# Patient Record
Sex: Male | Born: 1990 | Hispanic: No | Marital: Single | Smoking: Never smoker
Health system: Southern US, Community
[De-identification: ages and names within clinical notes are randomized; demographics above are authoritative.]

---
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
## 2010-09-18 ENCOUNTER — Emergency Department (HOSPITAL_COMMUNITY)
Admission: EM | Admit: 2010-09-18 | Discharge: 2010-09-18 | Disposition: A | Discharge status: Home / Self Care | Payer: Self-pay | Attending: Emergency Medicine | Admitting: Emergency Medicine

## 2010-09-18 ENCOUNTER — Emergency Department (HOSPITAL_COMMUNITY): Payer: Self-pay

## 2010-09-18 DIAGNOSIS — Z202 Contact with and (suspected) exposure to infections with a predominantly sexual mode of transmission: Secondary | ICD-10-CM | POA: Insufficient documentation

## 2010-09-18 DIAGNOSIS — M79609 Pain in unspecified limb: Secondary | ICD-10-CM | POA: Insufficient documentation

## 2010-09-18 DIAGNOSIS — W2209XA Striking against other stationary object, initial encounter: Secondary | ICD-10-CM | POA: Insufficient documentation

## 2010-09-18 DIAGNOSIS — Y929 Unspecified place or not applicable: Secondary | ICD-10-CM | POA: Insufficient documentation

## 2010-09-18 DIAGNOSIS — S62329A Displaced fracture of shaft of unspecified metacarpal bone, initial encounter for closed fracture: Secondary | ICD-10-CM | POA: Insufficient documentation

## 2010-09-18 DIAGNOSIS — M7989 Other specified soft tissue disorders: Secondary | ICD-10-CM | POA: Insufficient documentation

## 2010-09-19 LAB — GC/CHLAMYDIA PROBE AMP, GENITAL
Chlamydia, DNA Probe: NEGATIVE
GC Probe Amp, Genital: NEGATIVE

## 2011-03-08 ENCOUNTER — Emergency Department (HOSPITAL_COMMUNITY): Payer: No Typology Code available for payment source

## 2011-03-08 ENCOUNTER — Emergency Department (HOSPITAL_COMMUNITY)
Admission: EM | Admit: 2011-03-08 | Discharge: 2011-03-08 | Disposition: A | Discharge status: Home / Self Care | Payer: No Typology Code available for payment source | Attending: Emergency Medicine | Admitting: Emergency Medicine

## 2011-03-08 DIAGNOSIS — S060X0A Concussion without loss of consciousness, initial encounter: Secondary | ICD-10-CM | POA: Insufficient documentation

## 2011-03-08 DIAGNOSIS — R51 Headache: Secondary | ICD-10-CM | POA: Insufficient documentation

## 2011-03-10 ENCOUNTER — Other Ambulatory Visit: Payer: Self-pay | Admitting: Specialist

## 2011-03-10 ENCOUNTER — Ambulatory Visit: Payer: No Typology Code available for payment source

## 2011-03-10 DIAGNOSIS — R6889 Other general symptoms and signs: Secondary | ICD-10-CM

## 2011-03-31 ENCOUNTER — Ambulatory Visit
Admission: RE | Admit: 2011-03-31 | Discharge: 2011-03-31 | Disposition: A | Discharge status: Home / Self Care | Payer: No Typology Code available for payment source | Source: Ambulatory Visit | Attending: Specialist | Admitting: Specialist

## 2011-03-31 DIAGNOSIS — R6889 Other general symptoms and signs: Secondary | ICD-10-CM

## 2012-03-11 IMAGING — CR DG HAND COMPLETE 3+V*R*
3 series · 3 of 3 positions shown · non-contrast
Comparison: None

CLINICAL DATA: Injured right hand.

RIGHT HAND - COMPLETE 3+ VIEW

[x hand pa right]
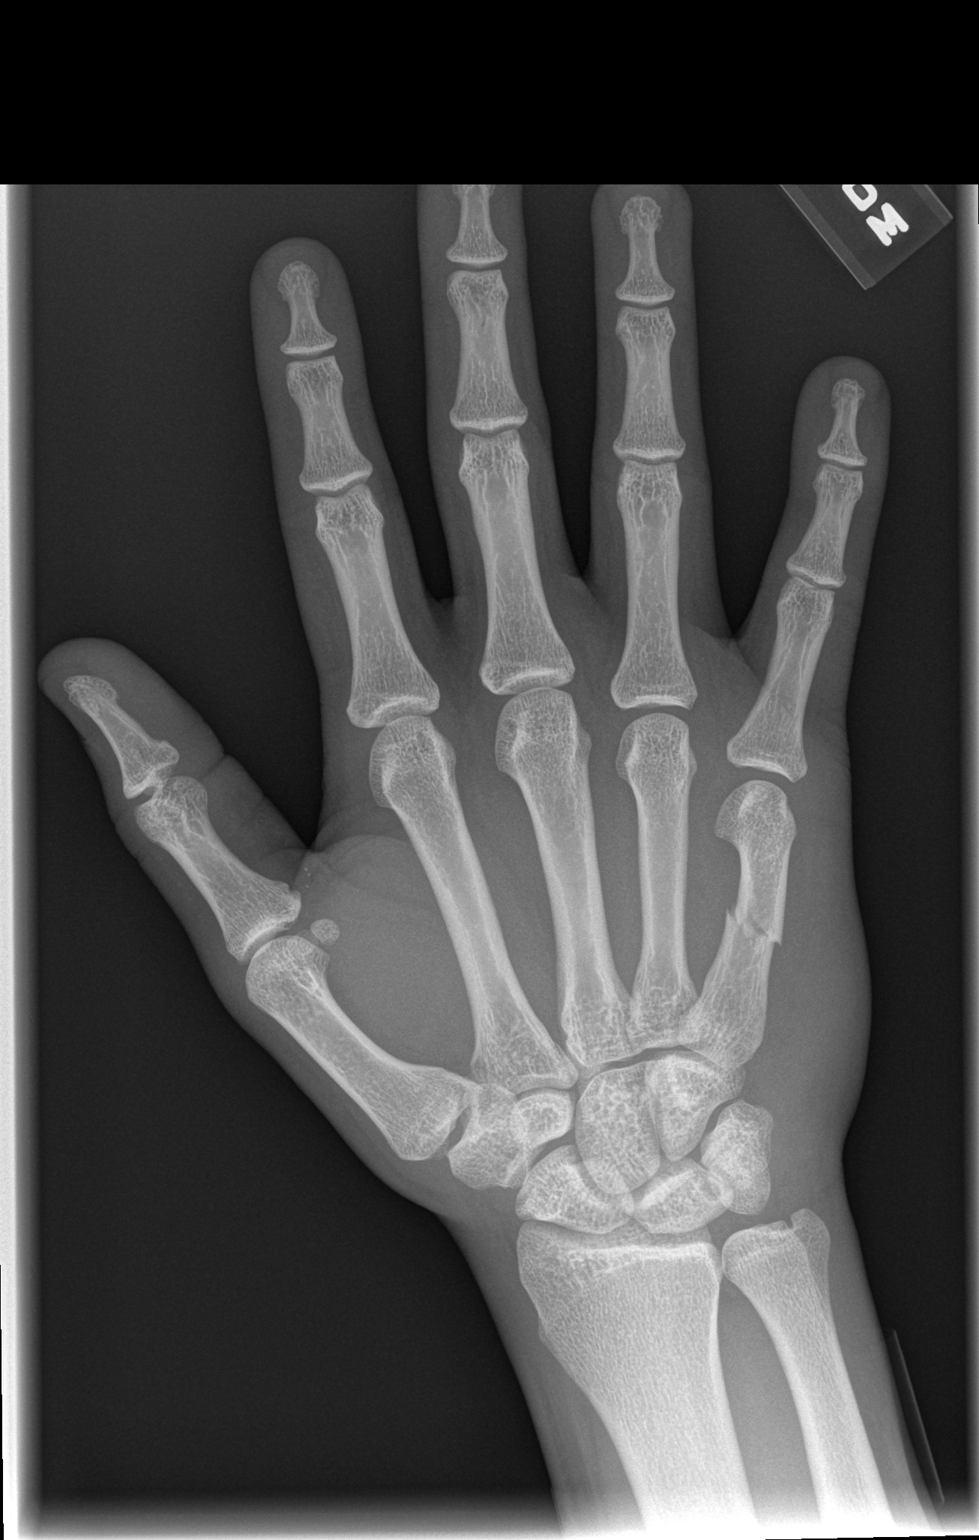

[x hand oblique right]
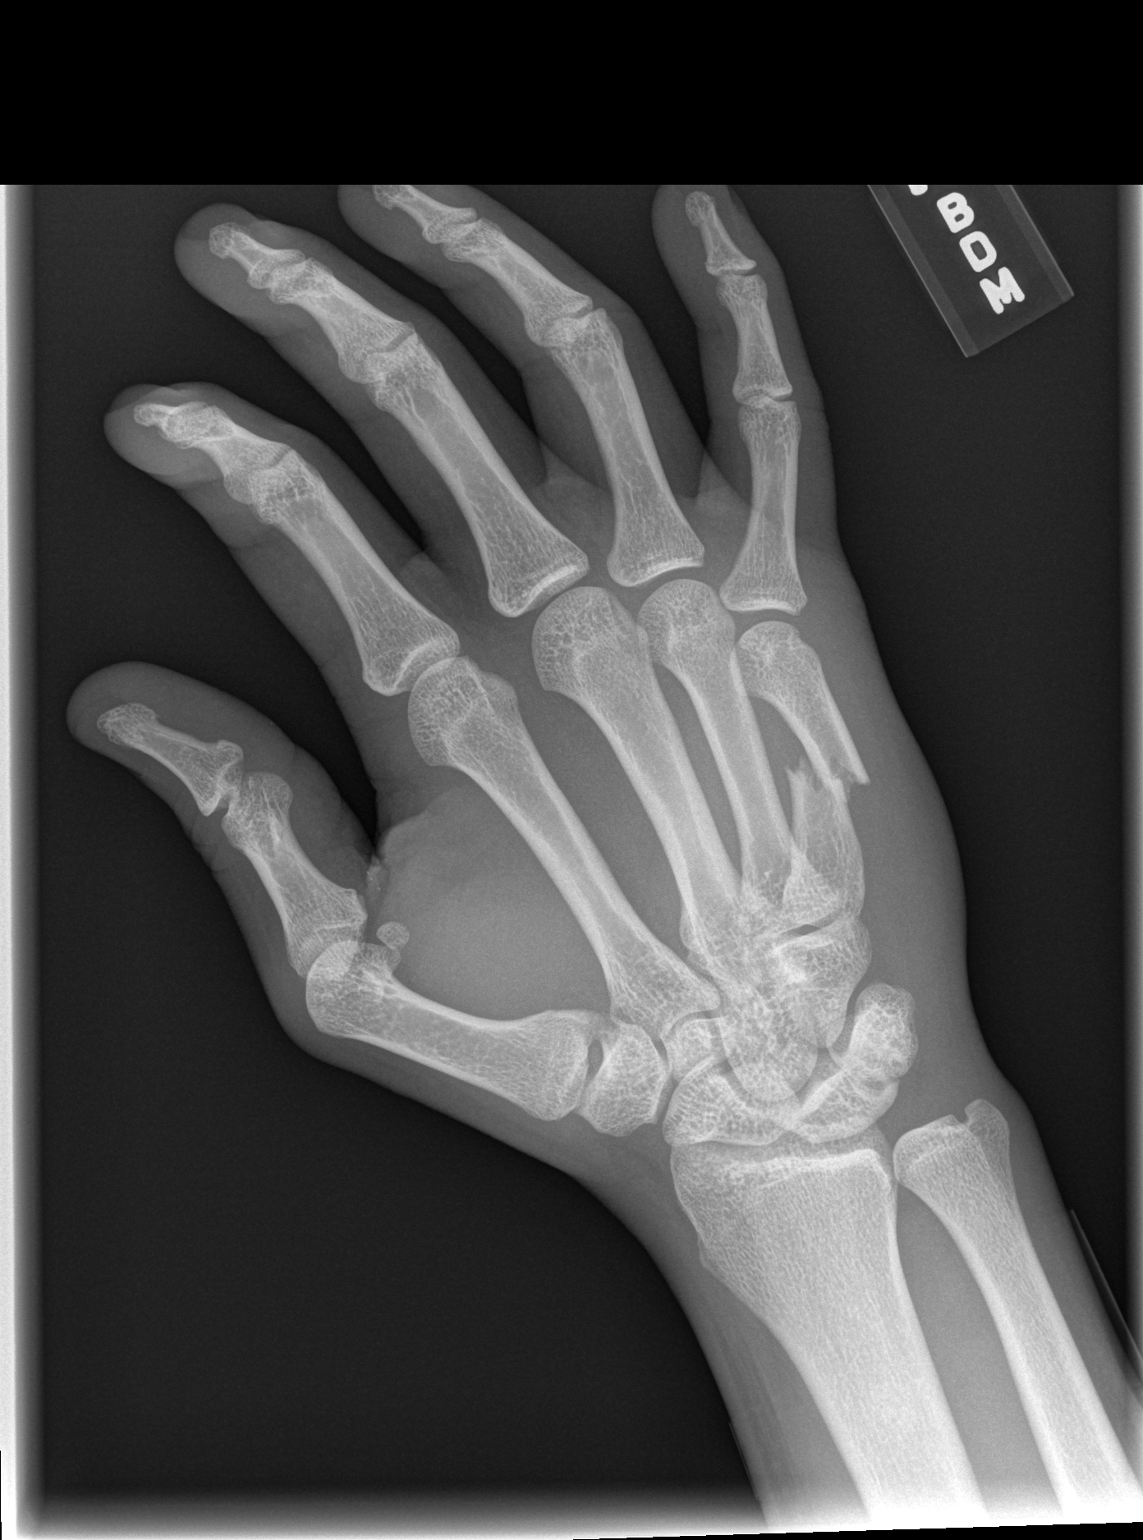

[x hand lat right]
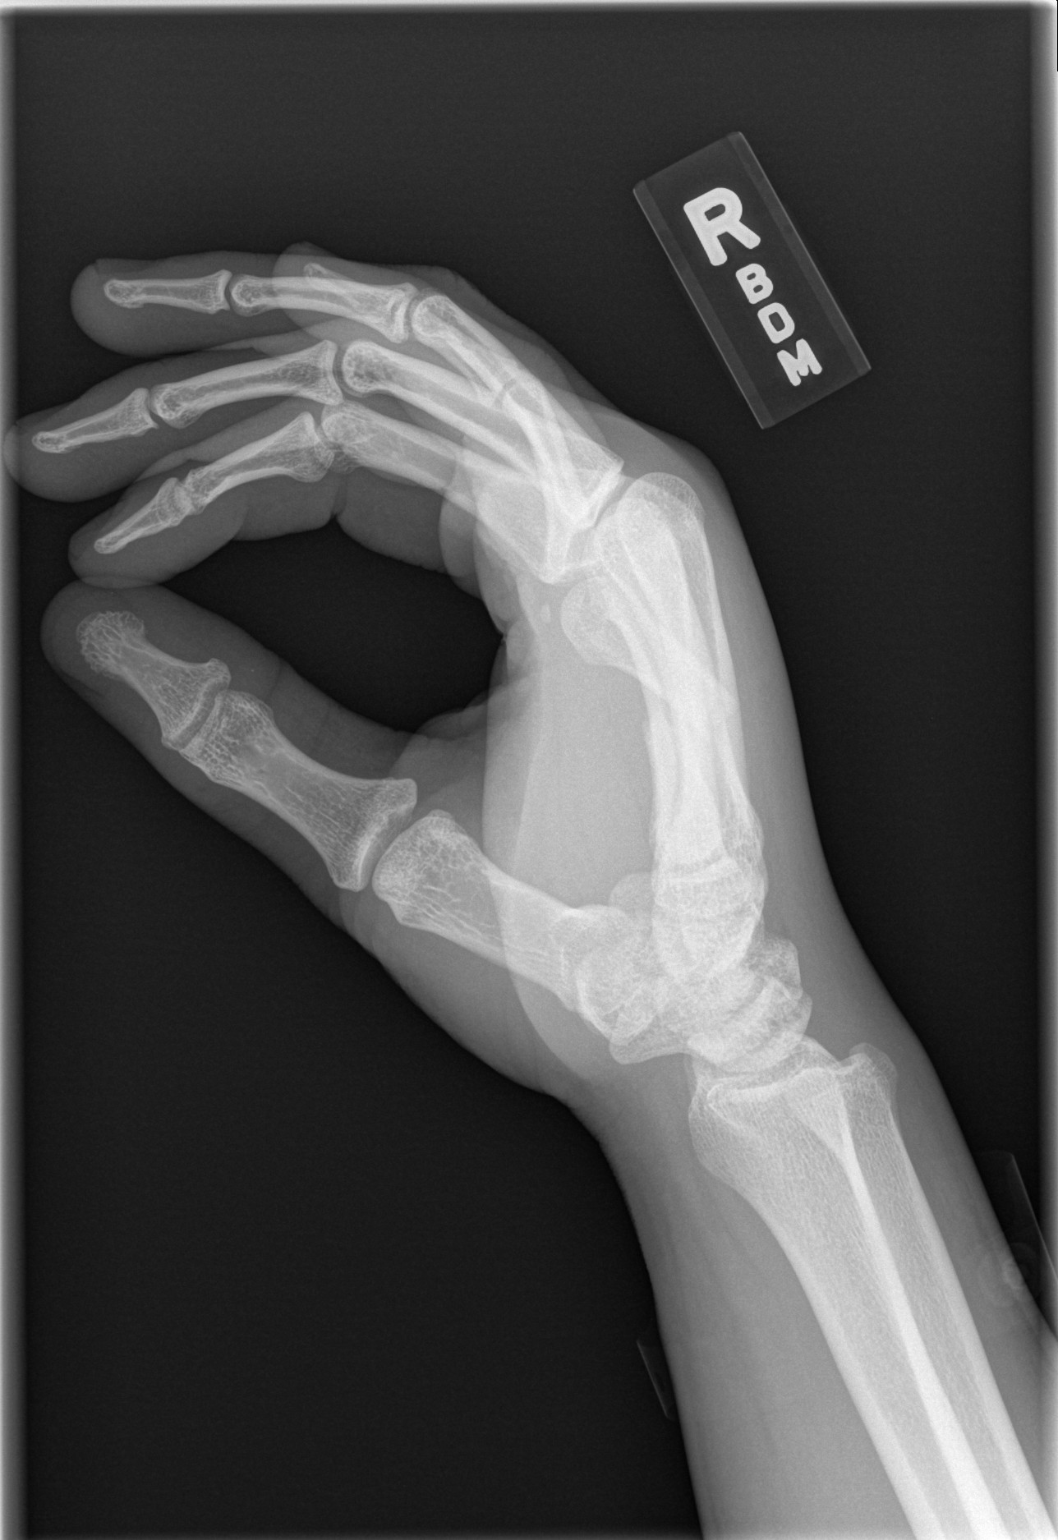

[3 of 3 positions shown; findings below may reference images not displayed]

FINDINGS: There is a displaced largely transverse fracture through
the mid fifth metacarpal shaft.  There is one shaft width of
posterior displacement.  The joint spaces are maintained.  No other
fractures are seen.
IMPRESSION: Displaced fifth metacarpal shaft fracture.

## 2015-12-26 ENCOUNTER — Encounter (HOSPITAL_COMMUNITY): Payer: Self-pay

## 2015-12-26 ENCOUNTER — Emergency Department (HOSPITAL_COMMUNITY): Payer: No Typology Code available for payment source

## 2015-12-26 ENCOUNTER — Emergency Department (HOSPITAL_COMMUNITY)
Admission: EM | Admit: 2015-12-26 | Discharge: 2015-12-26 | Disposition: A | Discharge status: Home / Self Care | Payer: Self-pay | Attending: Emergency Medicine | Admitting: Emergency Medicine

## 2015-12-26 DIAGNOSIS — R079 Chest pain, unspecified: Secondary | ICD-10-CM | POA: Insufficient documentation

## 2015-12-26 LAB — BASIC METABOLIC PANEL
ANION GAP: 8 (ref 5–15)
BUN: 15 mg/dL (ref 6–20)
CALCIUM: 9.2 mg/dL (ref 8.9–10.3)
CO2: 23 mmol/L (ref 22–32)
Chloride: 107 mmol/L (ref 101–111)
Creatinine, Ser: 1.23 mg/dL (ref 0.61–1.24)
GFR calc Af Amer: >60 mL/min (ref >60)
GLUCOSE: 101 mg/dL — AB (ref 65–99)
Potassium: 3.9 mmol/L (ref 3.5–5.1)
SODIUM: 138 mmol/L (ref 135–145)

## 2015-12-26 LAB — CBC
HCT: 38.6 % — ABNORMAL LOW (ref 39.0–52.0)
HEMOGLOBIN: 13.2 g/dL (ref 13.0–17.0)
MCH: 25.2 pg — ABNORMAL LOW (ref 26.0–34.0)
MCHC: 34.2 g/dL (ref 30.0–36.0)
MCV: 73.7 fL — ABNORMAL LOW (ref 78.0–100.0)
Platelets: 156 10*3/uL (ref 150–400)
RBC: 5.24 MIL/uL (ref 4.22–5.81)
RDW: 13.1 % (ref 11.5–15.5)
WBC: 3.1 10*3/uL — AB (ref 4.0–10.5)

## 2015-12-26 LAB — I-STAT TROPONIN, ED: TROPONIN I, POC: 0.01 ng/mL (ref 0.00–0.08)

## 2015-12-26 NOTE — ED Notes (Signed)
[facility]- pt coming from work with c/o left side chest pressure. Pt denies pain on arrival. No meds given PTA. Pt has been fasting due to religious believes, CBG 104. Pt is alert and oriented.

## 2015-12-26 NOTE — Discharge Instructions (Signed)
Nonspecific Chest Pain  °Chest pain can be caused by many different conditions. There is always a chance that your pain could be related to something serious, such as a heart attack or a blood clot in your lungs. Chest pain can also be caused by conditions that are not life-threatening. If you have chest pain, it is very important to follow up with your health care provider. °CAUSES  °Chest pain can be caused by: °· Heartburn. °· Pneumonia or bronchitis. °· Anxiety or stress. °· Inflammation around your heart (pericarditis) or lung (pleuritis or pleurisy). °· A blood clot in your lung. °· A collapsed lung (pneumothorax). It can develop suddenly on its own (spontaneous pneumothorax) or from trauma to the chest. °· Shingles infection (varicella-zoster virus). °· Heart attack. °· Damage to the bones, muscles, and cartilage that make up your chest wall. This can include: °¨ Bruised bones due to injury. °¨ Strained muscles or cartilage due to frequent or repeated coughing or overwork. °¨ Fracture to one or more ribs. °¨ Sore cartilage due to inflammation (costochondritis). °RISK FACTORS  °Risk factors for chest pain may include: °· Activities that increase your risk for trauma or injury to your chest. °· Respiratory infections or conditions that cause frequent coughing. °· Medical conditions or overeating that can cause heartburn. °· Heart disease or family history of heart disease. °· Conditions or health behaviors that increase your risk of developing a blood clot. °· Having had chicken pox (varicella zoster). °SIGNS AND SYMPTOMS °Chest pain can feel like: °· Burning or tingling on the surface of your chest or deep in your chest. °· Crushing, pressure, aching, or squeezing pain. °· Dull or sharp pain that is worse when you move, cough, or take a deep breath. °· Pain that is also felt in your back, neck, shoulder, or arm, or pain that spreads to any of these areas. °Your chest pain may come and go, or it may stay  constant. °DIAGNOSIS °Lab tests or other studies may be needed to find the cause of your pain. Your health care provider may have you take a test called an ambulatory ECG (electrocardiogram). An ECG records your heartbeat patterns at the time the test is performed. You may also have other tests, such as: °· Transthoracic echocardiogram (TTE). During echocardiography, sound waves are used to create a picture of all of the heart structures and to look at how blood flows through your heart. °· Transesophageal echocardiogram (TEE). This is a more advanced imaging test that obtains images from inside your body. It allows your health care provider to see your heart in finer detail. °· Cardiac monitoring. This allows your health care provider to monitor your heart rate and rhythm in real time. °· Holter monitor. This is a portable device that records your heartbeat and can help to diagnose abnormal heartbeats. It allows your health care provider to track your heart activity for several days, if needed. °· Stress tests. These can be done through exercise or by taking medicine that makes your heart beat more quickly. °· Blood tests. °· Imaging tests. °TREATMENT  °Your treatment depends on what is causing your chest pain. Treatment may include: °· Medicines. These may include: °¨ Acid blockers for heartburn. °¨ Anti-inflammatory medicine. °¨ Pain medicine for inflammatory conditions. °¨ Antibiotic medicine, if an infection is present. °¨ Medicines to dissolve blood clots. °¨ Medicines to treat coronary artery disease. °· Supportive care for conditions that do not require medicines. This may include: °¨ Resting. °¨ Applying heat   or cold packs to injured areas. °¨ Limiting activities until pain decreases. °HOME CARE INSTRUCTIONS °· If you were prescribed an antibiotic medicine, finish it all even if you start to feel better. °· Avoid any activities that bring on chest pain. °· Do not use any tobacco products, including  cigarettes, chewing tobacco, or electronic cigarettes. If you need help quitting, ask your health care provider. °· Do not drink alcohol. °· Take medicines only as directed by your health care provider. °· Keep all follow-up visits as directed by your health care provider. This is important. This includes any further testing if your chest pain does not go away. °· If heartburn is the cause for your chest pain, you may be told to keep your head raised (elevated) while sleeping. This reduces the chance that acid will go from your stomach into your esophagus. °· Make lifestyle changes as directed by your health care provider. These may include: °¨ Getting regular exercise. Ask your health care provider to suggest some activities that are safe for you. °¨ Eating a heart-healthy diet. A registered dietitian can help you to learn healthy eating options. °¨ Maintaining a healthy weight. °¨ Managing diabetes, if necessary. °¨ Reducing stress. °SEEK MEDICAL CARE IF: °· Your chest pain does not go away after treatment. °· You have a rash with blisters on your chest. °· You have a fever. °SEEK IMMEDIATE MEDICAL CARE IF:  °· Your chest pain is worse. °· You have an increasing cough, or you cough up blood. °· You have severe abdominal pain. °· You have severe weakness. °· You faint. °· You have chills. °· You have sudden, unexplained chest discomfort. °· You have sudden, unexplained discomfort in your arms, back, neck, or jaw. °· You have shortness of breath at any time. °· You suddenly start to sweat, or your skin gets clammy. °· You feel nauseous or you vomit. °· You suddenly feel light-headed or dizzy. °· Your heart begins to beat quickly, or it feels like it is skipping beats. °These symptoms may represent a serious problem that is an emergency. Do not wait to see if the symptoms will go away. Get medical help right away. Call your local emergency services (911 in the [place] Do not drive yourself to the hospital. °  °This  information is not intended to replace advice given to you by your health care provider. Make sure you discuss any questions you have with your health care provider. °  °Document Released: 04/08/2005 Document Revised: 07/20/2014 Document Reviewed: 02/02/2014 °Elsevier Interactive Patient Education ©2016 [employer] ° °

## 2015-12-26 NOTE — ED Provider Notes (Signed)
CSN: [id number]     Arrival date & time 12/26/15  1047 History   First MD Initiated Contact with Patient 12/26/15 1053     Chief Complaint  Patient presents with  . Chest Pain     (Consider location/radiation/quality/duration/timing/severity/associated sxs/prior Treatment) HPI Comments: Patient presents to the emergency department with chief complaint of brief, fleeting, left sided chest pain. He states that he had sharp chest pain which lasted 5-6 seconds while at work today. He denies any associated shortness breath, radiating pain, diaphoresis, or nausea. There are no modifying factors. There are no exertional symptoms. States he is a former smoker. He denies any other associated symptoms. He has not taken anything for symptoms. He has no symptoms now.  The history is provided by the patient. No language interpreter was used.    History reviewed. No pertinent past medical history. History reviewed. No pertinent past surgical history. History reviewed. No pertinent family history. Social History  Substance Use Topics  . Smoking status: Never Smoker   . Smokeless tobacco: None  . Alcohol Use: No    Review of Systems  Cardiovascular: Positive for chest pain.  All other systems reviewed and are negative.     Allergies  Review of patient's allergies indicates no known allergies.  Home Medications   Prior to Admission medications   Not on File   BP 132/72 mmHg  Pulse 78  Temp(Src) 98.8 F (37.1 C) (Oral)  Resp 27  SpO2 100% Physical Exam  Constitutional: He is oriented to person, place, and time. He appears well-developed and well-nourished.  HENT:  Head: Normocephalic and atraumatic.  Eyes: Conjunctivae and EOM are normal. Pupils are equal, round, and reactive to light. Right eye exhibits no discharge. Left eye exhibits no discharge. No scleral icterus.  Neck: Normal range of motion. Neck supple. No JVD present.  Cardiovascular: Normal rate, regular rhythm and normal  heart sounds.  Exam reveals no gallop and no friction rub.   No murmur heard. Pulmonary/Chest: Effort normal and breath sounds normal. No respiratory distress. He has no wheezes. He has no rales. He exhibits no tenderness.  Abdominal: Soft. He exhibits no distension and no mass. There is no tenderness. There is no rebound and no guarding.  Musculoskeletal: Normal range of motion. He exhibits no edema or tenderness.  Neurological: He is alert and oriented to person, place, and time.  Skin: Skin is warm and dry.  Psychiatric: He has a normal mood and affect. His behavior is normal. Judgment and thought content normal.  Nursing note and vitals reviewed.   ED Course  Procedures (including critical care time) Labs Review Labs Reviewed  CBC - Abnormal; Notable for the following:    WBC 3.1 (*)    HCT 38.6 (*)    MCV 73.7 (*)    MCH 25.2 (*)    All other components within normal limits  BASIC METABOLIC PANEL  I-STAT TROPOININ, ED    Imaging Review Dg Chest 2 View  12/26/2015  CLINICAL DATA:  Left-sided chest pain with difficulty taking deep breath. EXAM: CHEST  2 VIEW COMPARISON:  03/31/2011 FINDINGS: Cardiomediastinal silhouette is normal. Mediastinal contours appear intact. There is no evidence of focal airspace consolidation, pleural effusion or pneumothorax. Osseous structures are without acute abnormality. Soft tissues are grossly normal. IMPRESSION: No active cardiopulmonary disease. Electronically Signed   By: Ted Mcalpine M.D.   On: 12/26/2015 11:31   I have personally reviewed and evaluated these images and lab results as part of  my medical decision-making.   EKG Interpretation   Date/Time:  Thursday December 26 2015 10:51:18 EDT Ventricular Rate:  79 PR Interval:  179 QRS Duration: 112 QT Interval:  391 QTC Calculation: 448 R Axis:   99 Text Interpretation:  Sinus rhythm Incomplete right bundle branch block ST  elev, probable normal early repol pattern No previous ECGs  available  Confirmed by NGUYEN, EMILY ([id number]) on 12/26/2015 10:56:49 AM      MDM   Final diagnoses:  Chest pain, unspecified chest pain type    Patient with brief, fleeting, left-sided chest pain. Symptoms lasted approximately 5-6 seconds. EKG shows normal early repolarization, chest x-ray is negative, labs ordered in triage are pending.  Troponin is negative.  Patient is low risk for ACS. Doubt PE, PERC negative. Plan for discharge to home with primary care follow-up.    Roxy Horsemanobert Jamelle Goldston, PA-C 12/26/15 1215  Leta BaptistEmily Roe Nguyen, MD 01/01/16 [phone]

## 2017-06-18 IMAGING — CR DG CHEST 2V
2 series · 2 of 2 positions shown · non-contrast
Comparison: 03/31/2011

CLINICAL DATA: Left-sided chest pain with difficulty taking deep
breath.

EXAM:
CHEST  2 VIEW

[chest pa]
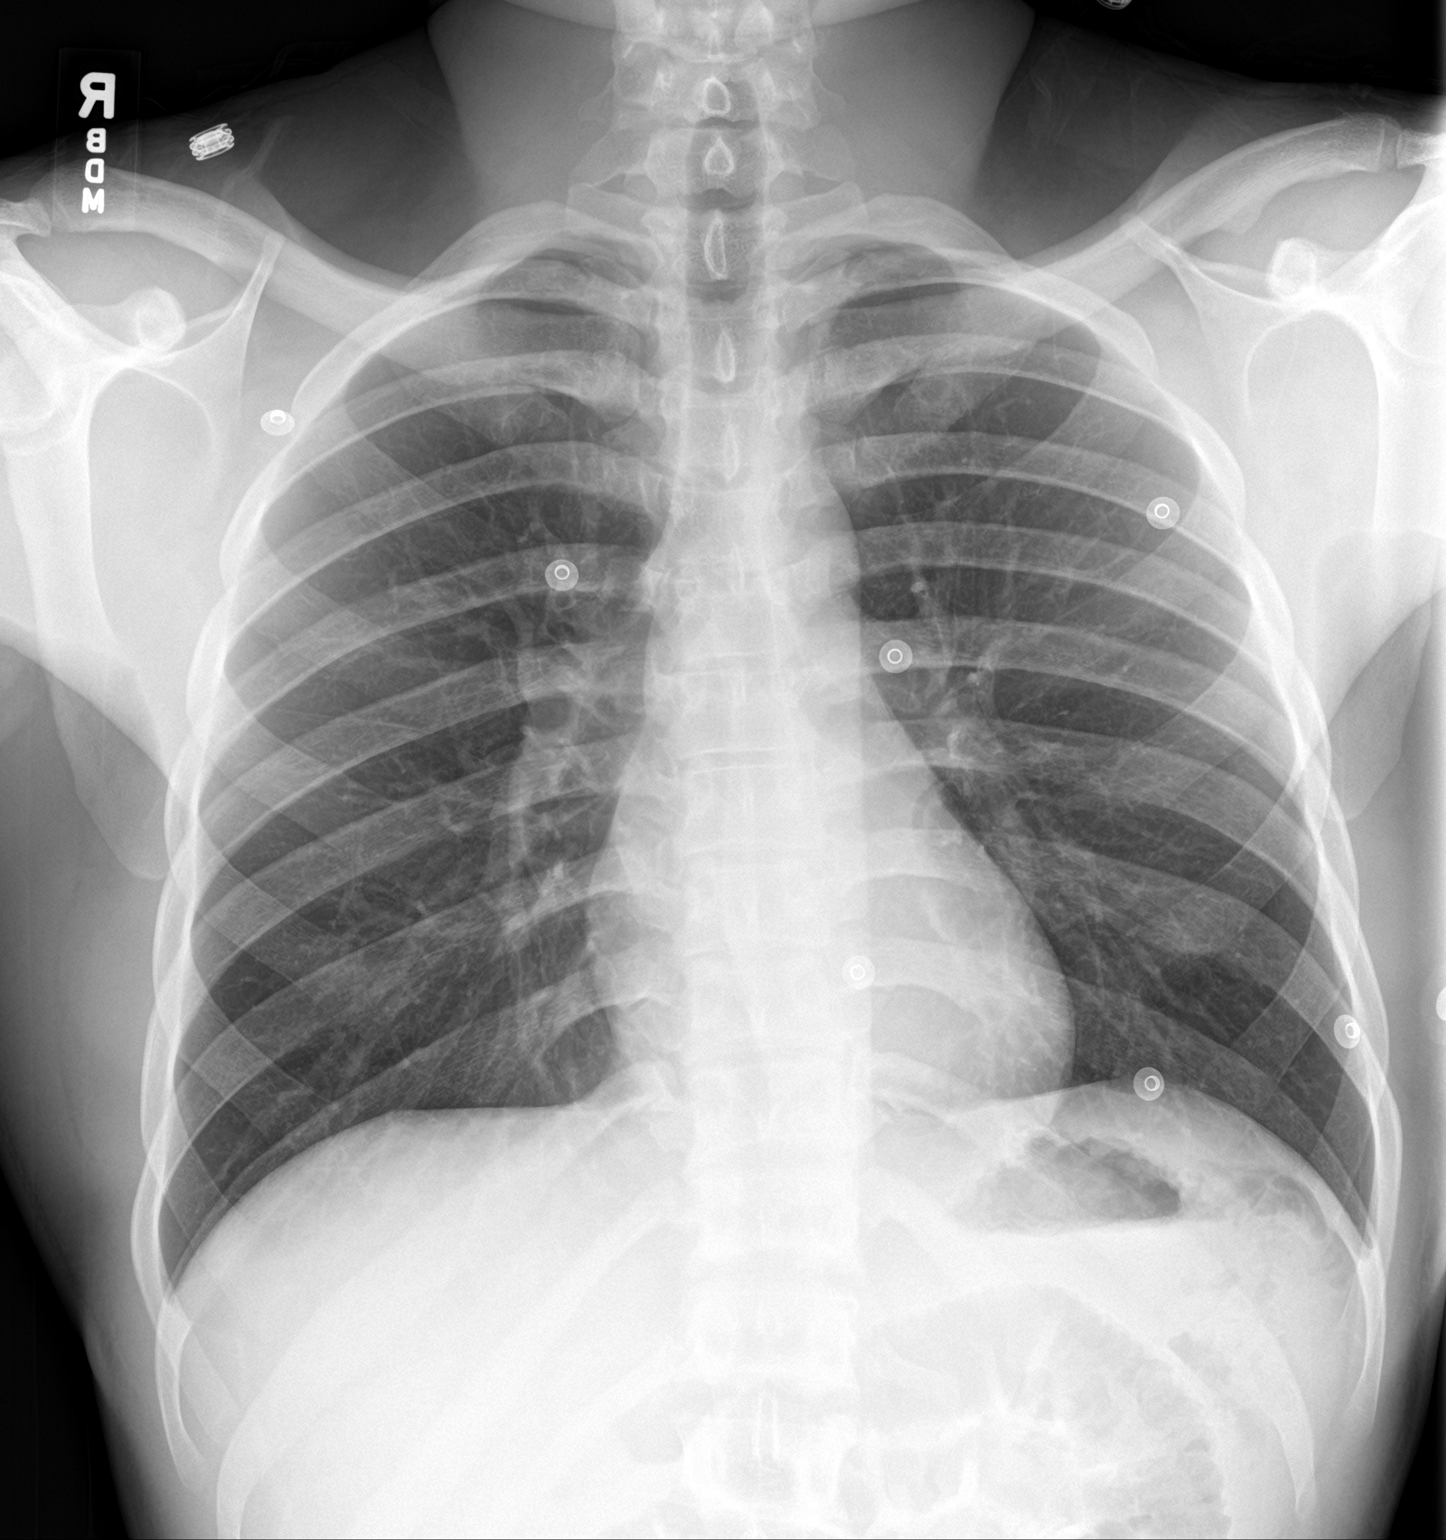

[chest lat]
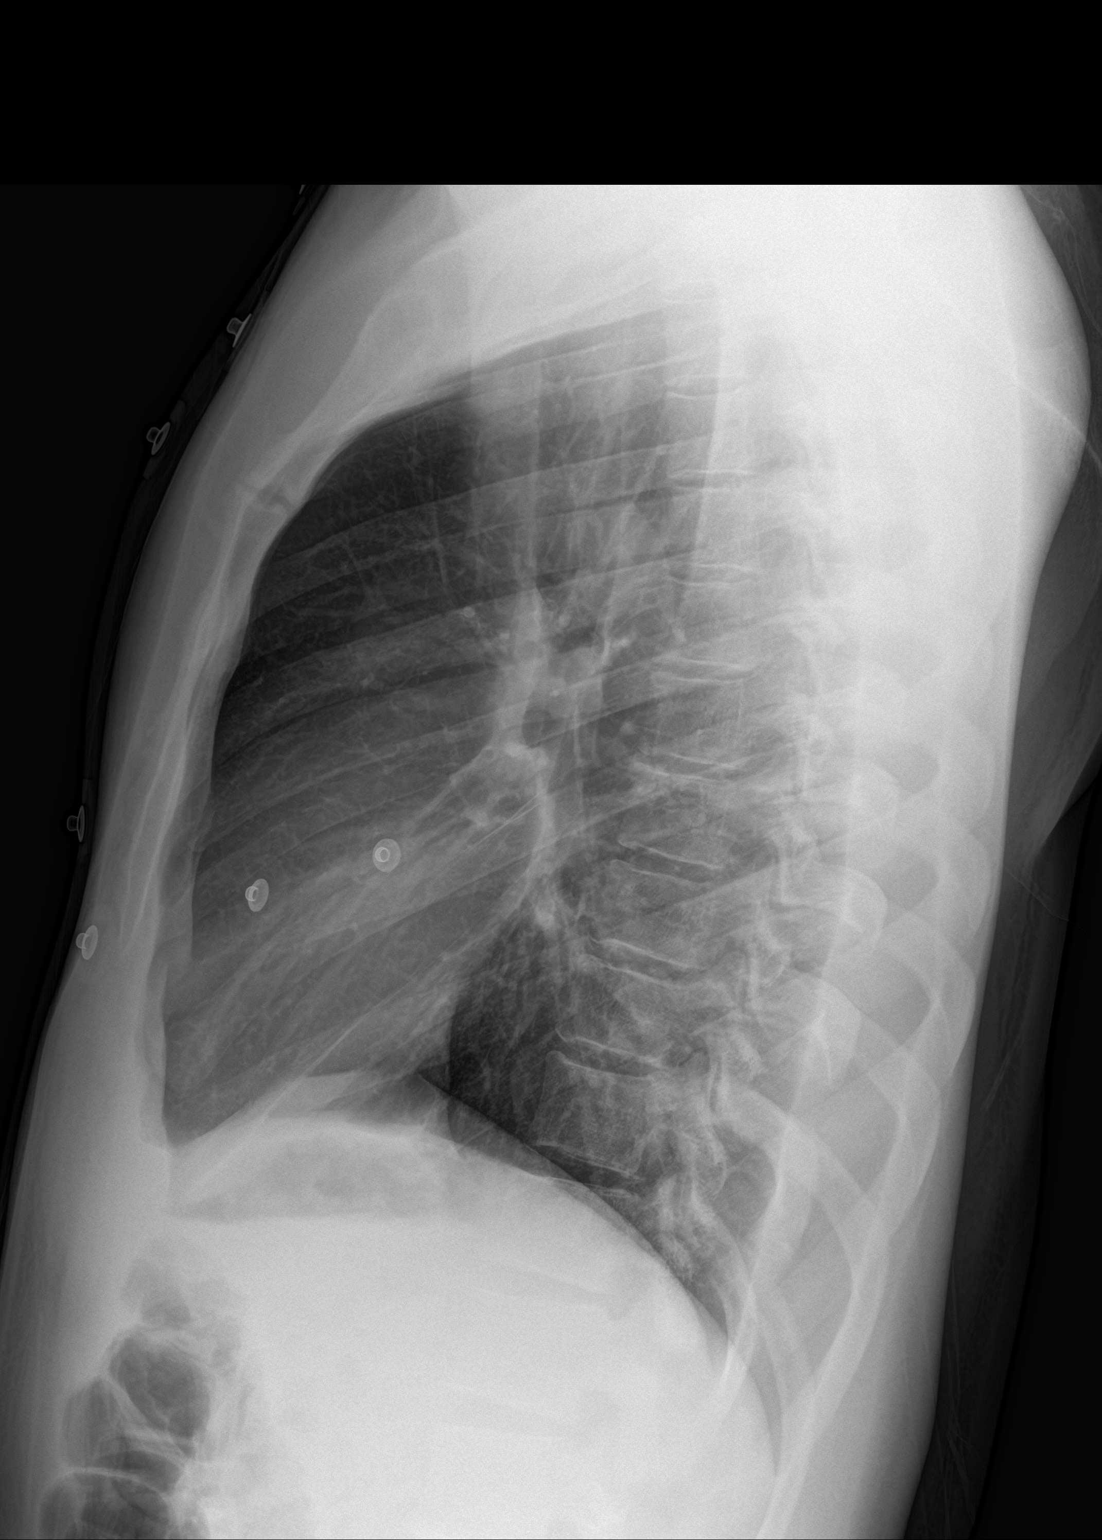

[2 of 2 positions shown; findings below may reference images not displayed]

FINDINGS: Cardiomediastinal silhouette is normal. Mediastinal contours appear
intact.

There is no evidence of focal airspace consolidation, pleural
effusion or pneumothorax.

Osseous structures are without acute abnormality. Soft tissues are
grossly normal.
IMPRESSION: No active cardiopulmonary disease.

## 2023-05-16 ENCOUNTER — Encounter (HOSPITAL_COMMUNITY): Payer: Self-pay

## 2023-05-16 ENCOUNTER — Ambulatory Visit (HOSPITAL_COMMUNITY)
Admission: EM | Admit: 2023-05-16 | Discharge: 2023-05-16 | Disposition: A | Discharge status: Home / Self Care | Payer: Self-pay | Attending: Nurse Practitioner | Admitting: Nurse Practitioner

## 2023-05-16 DIAGNOSIS — B9689 Other specified bacterial agents as the cause of diseases classified elsewhere: Secondary | ICD-10-CM

## 2023-05-16 DIAGNOSIS — J019 Acute sinusitis, unspecified: Secondary | ICD-10-CM

## 2023-05-16 MED ORDER — PROMETHAZINE-DM 6.25-15 MG/5ML PO SYRP: 5.0000 mL | ORAL_SOLUTION | Freq: Four times a day (QID) | ORAL | 0 refills | Status: AC | PRN

## 2023-05-16 MED ORDER — AMOXICILLIN-POT CLAVULANATE 875-125 MG PO TABS: 1.0000 | ORAL_TABLET | Freq: Two times a day (BID) | ORAL | 0 refills | Status: AC

## 2023-05-16 NOTE — ED Provider Notes (Signed)
[facility]    CSN: [id number] Arrival date & time: 05/16/23  1007      History   Chief Complaint Chief Complaint  Patient presents with   Cough   Sore Throat    HPI Robert Parrish is a 32 y.o. male.   Patient presents today with 10-day history of congested cough with green and occasional blood-tinged sputum, shortness of breath at night, runny and stuffy nose, sore throat, sinus pressure and headache, decreased appetite, fatigue, and chills at nighttime.  He denies known fevers, wheezing, chest pain or tightness, postnasal drainage, ear pain, abdominal pain, nausea/vomiting, or diarrhea.  No known sick contacts.  Has been taking ginger water and sore throat spray without relief.  Patient denies chronic lung disease.  He has never needed inhalers.     History reviewed. No pertinent past medical history.  There are no problems to display for this patient.   History reviewed. No pertinent surgical history.     Home Medications    Prior to Admission medications   Medication Sig Start Date End Date Taking? Authorizing Provider  amoxicillin-clavulanate (AUGMENTIN) 875-125 MG tablet Take 1 tablet by mouth 2 (two) times daily for 7 days. 05/16/23 05/23/23 Yes Valentino Nose, NP  promethazine-dextromethorphan (PROMETHAZINE-DM) 6.25-15 MG/5ML syrup Take 5 mLs by mouth 4 (four) times daily as needed for cough. Do not take with alcohol or while driving or operating heavy machinery.  May cause drowsiness. 05/16/23  Yes Valentino Nose, NP    Family History History reviewed. No pertinent family history.  Social History Social History   Tobacco Use   Smoking status: Never  Vaping Use   Vaping status: Never Used  Substance Use Topics   Alcohol use: No   Drug use: No     Allergies   Patient has no known allergies.   Review of Systems Review of Systems Per HPI  Physical Exam Triage Vital Signs ED Triage Vitals  Encounter Vitals Group     BP  05/16/23 1029 120/73     Systolic BP Percentile --      Diastolic BP Percentile --      Pulse Rate 05/16/23 1029 81     Resp 05/16/23 1029 16     Temp 05/16/23 1029 98.3 F (36.8 C)     Temp Source 05/16/23 1029 Oral     SpO2 05/16/23 1029 97 %     Weight --      Height --      Head Circumference --      Peak Flow --      Pain Score 05/16/23 1028 9     Pain Loc --      Pain Education --      Exclude from Growth Chart --    No data found.  Updated Vital Signs BP 120/73 (BP Location: Left Arm)   Pulse 81   Temp 98.3 F (36.8 C) (Oral)   Resp 16   SpO2 97%   Visual Acuity Right Eye Distance:   Left Eye Distance:   Bilateral Distance:    Right Eye Near:   Left Eye Near:    Bilateral Near:     Physical Exam Vitals and nursing note reviewed.  Constitutional:      General: He is not in acute distress.    Appearance: Normal appearance. He is not ill-appearing or toxic-appearing.  HENT:     Head: Normocephalic and atraumatic.     Right Ear: Tympanic membrane, ear  canal and external ear normal. No drainage, swelling or tenderness. No middle ear effusion. Tympanic membrane is not erythematous.     Left Ear: Tympanic membrane, ear canal and external ear normal. No drainage, swelling or tenderness.  No middle ear effusion. Tympanic membrane is not erythematous.     Nose: Congestion present. No rhinorrhea.     Right Sinus: Maxillary sinus tenderness and frontal sinus tenderness present.     Left Sinus: Maxillary sinus tenderness and frontal sinus tenderness present.     Mouth/Throat:     Mouth: Mucous membranes are moist.     Pharynx: Oropharynx is clear. Posterior oropharyngeal erythema present. No oropharyngeal exudate.  Eyes:     General: No scleral icterus.    Extraocular Movements: Extraocular movements intact.  Cardiovascular:     Rate and Rhythm: Normal rate and regular rhythm.  Pulmonary:     Effort: Pulmonary effort is normal. No respiratory distress.     Breath  sounds: Normal breath sounds. No wheezing, rhonchi or rales.  Abdominal:     General: Abdomen is flat. Bowel sounds are normal. There is no distension.     Palpations: Abdomen is soft.  Musculoskeletal:     Cervical back: Normal range of motion and neck supple.  Lymphadenopathy:     Cervical: No cervical adenopathy.  Skin:    General: Skin is warm and dry.     Coloration: Skin is not jaundiced or pale.     Findings: No erythema or rash.  Neurological:     Mental Status: He is alert and oriented to person, place, and time.     Motor: No weakness.  Psychiatric:        Behavior: Behavior is cooperative.      UC Treatments / Results  Labs (all labs ordered are listed, but only abnormal results are displayed) Labs Reviewed - No data to display  EKG   Radiology No results found.  Procedures Procedures (including critical care time)  Medications Ordered in UC Medications - No data to display  Initial Impression / Assessment and Plan / UC Course  I have reviewed the triage vital signs and the nursing notes.  Pertinent labs & imaging results that were available during my care of the patient were reviewed by me and considered in my medical decision making (see chart for details).   Patient is well-appearing, normotensive, afebrile, not tachycardic, not tachypneic, oxygenating well on room air.    1. Acute bacterial sinusitis Treat with Augmentin twice daily for 7 days Other supportive care discussed Promethazine-DM sent to the pharmacy to help with cough Return and ER precautions discussed with patient  The patient was given the opportunity to ask questions.  All questions answered to their satisfaction.  The patient is in agreement to this plan.   Final Clinical Impressions(s) / UC Diagnoses   Final diagnoses:  Acute bacterial sinusitis     Discharge Instructions      You have a sinus infection.  Take the Augmentin as prescribed to treat it.  Symptoms should  improve over the next week to 10 days.  If you develop chest pain or shortness of breath, go to the emergency room.  Some things that can make you feel better are: - Increased rest - Increasing fluid with water/sugar free electrolytes - Acetaminophen and ibuprofen as needed for fever/pain - Salt water gargling, chloraseptic spray and throat lozenges for sore throat - OTC guaifenesin (Mucinex) 600 mg twice daily for congestion - Saline sinus  flushes or a neti pot - Humidifying the air - Cough syrup as needed for dry cough    ED Prescriptions     Medication Sig Dispense Auth. Provider   amoxicillin-clavulanate (AUGMENTIN) 875-125 MG tablet Take 1 tablet by mouth 2 (two) times daily for 7 days. 14 tablet Cathlean Marseilles A, NP   promethazine-dextromethorphan (PROMETHAZINE-DM) 6.25-15 MG/5ML syrup Take 5 mLs by mouth 4 (four) times daily as needed for cough. Do not take with alcohol or while driving or operating heavy machinery.  May cause drowsiness. 118 mL Valentino Nose, NP      PDMP not reviewed this encounter.   Valentino Nose, NP 05/16/23 1051

## 2023-05-16 NOTE — Discharge Instructions (Signed)
You have a sinus infection.  Take the Augmentin as prescribed to treat it.  Symptoms should improve over the next week to 10 days.  If you develop chest pain or shortness of breath, go to the emergency room.  Some things that can make you feel better are: - Increased rest - Increasing fluid with water/sugar free electrolytes - Acetaminophen and ibuprofen as needed for fever/pain - Salt water gargling, chloraseptic spray and throat lozenges for sore throat - OTC guaifenesin (Mucinex) 600 mg twice daily for congestion - Saline sinus flushes or a neti pot - Humidifying the air - Cough syrup as needed for dry cough

## 2023-05-16 NOTE — ED Triage Notes (Signed)
Patient reports that he has had a productive cough that is yellow/green and blood tinged and a sore throat x 2 weeks. Patient states symptoms worse at night.  Patient states he has had ginger water and sore throat spray.

## 2024-01-29 ENCOUNTER — Emergency Department (HOSPITAL_COMMUNITY)

## 2024-01-29 ENCOUNTER — Emergency Department (HOSPITAL_COMMUNITY)
Admission: EM | Admit: 2024-01-29 | Discharge: 2024-01-29 | Disposition: A | Discharge status: Home / Self Care | Attending: Emergency Medicine | Admitting: Emergency Medicine

## 2024-01-29 DIAGNOSIS — M25562 Pain in left knee: Secondary | ICD-10-CM | POA: Diagnosis not present

## 2024-01-29 DIAGNOSIS — M79631 Pain in right forearm: Secondary | ICD-10-CM | POA: Diagnosis present

## 2024-01-29 DIAGNOSIS — L089 Local infection of the skin and subcutaneous tissue, unspecified: Secondary | ICD-10-CM | POA: Diagnosis not present

## 2024-01-29 DIAGNOSIS — Z23 Encounter for immunization: Secondary | ICD-10-CM | POA: Insufficient documentation

## 2024-01-29 DIAGNOSIS — M7989 Other specified soft tissue disorders: Secondary | ICD-10-CM | POA: Diagnosis not present

## 2024-01-29 MED ORDER — KETOROLAC TROMETHAMINE 15 MG/ML IJ SOLN
15.0000 mg | Freq: Once | INTRAMUSCULAR | Status: AC
  Administered 2024-01-29: 15 mg via INTRAMUSCULAR
  Filled 2024-01-29: qty 1

## 2024-01-29 MED ORDER — CEPHALEXIN 500 MG PO CAPS: 500.0000 mg | ORAL_CAPSULE | Freq: Four times a day (QID) | ORAL | 0 refills | Status: AC

## 2024-01-29 MED ORDER — CEPHALEXIN 500 MG PO CAPS
500.0000 mg | ORAL_CAPSULE | Freq: Once | ORAL | Status: AC
  Administered 2024-01-29: 500 mg via ORAL
  Filled 2024-01-29: qty 1

## 2024-01-29 MED ORDER — TETANUS-DIPHTH-ACELL PERTUSSIS 5-2.5-18.5 LF-MCG/0.5 IM SUSY
0.5000 mL | PREFILLED_SYRINGE | Freq: Once | INTRAMUSCULAR | Status: AC
  Administered 2024-01-29: 0.5 mL via INTRAMUSCULAR
  Filled 2024-01-29: qty 0.5

## 2024-01-29 NOTE — ED Notes (Signed)
 Patient transported to X-ray
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
## 2024-03-03 ENCOUNTER — Other Ambulatory Visit: Payer: Self-pay

## 2024-03-03 ENCOUNTER — Encounter (HOSPITAL_COMMUNITY): Payer: Self-pay

## 2024-03-03 ENCOUNTER — Emergency Department (HOSPITAL_COMMUNITY): Payer: Worker's Compensation

## 2024-03-03 ENCOUNTER — Emergency Department (HOSPITAL_COMMUNITY)
Admission: EM | Admit: 2024-03-03 | Discharge: 2024-03-03 | Disposition: A | Discharge status: Home / Self Care | Payer: Worker's Compensation | Attending: Emergency Medicine | Admitting: Emergency Medicine

## 2024-03-03 DIAGNOSIS — W231XXA Caught, crushed, jammed, or pinched between stationary objects, initial encounter: Secondary | ICD-10-CM | POA: Insufficient documentation

## 2024-03-03 DIAGNOSIS — S6992XA Unspecified injury of left wrist, hand and finger(s), initial encounter: Secondary | ICD-10-CM | POA: Diagnosis present

## 2024-03-03 DIAGNOSIS — Y990 Civilian activity done for income or pay: Secondary | ICD-10-CM | POA: Insufficient documentation

## 2024-03-03 MED ORDER — HYDROCODONE-ACETAMINOPHEN 5-325 MG PO TABS
1.0000 | ORAL_TABLET | Freq: Once | ORAL | Status: AC
  Administered 2024-03-03: 1 via ORAL
  Filled 2024-03-03: qty 1

## 2024-03-03 MED ORDER — NAPROXEN 500 MG PO TABS: 500.0000 mg | ORAL_TABLET | Freq: Two times a day (BID) | ORAL | 0 refills | Status: DC

## 2024-03-03 NOTE — ED Triage Notes (Signed)
 Pov from work Cc of getting left hand caught in roller machine at work. Hurts to move fingers Tetanus utd

## 2024-03-03 NOTE — Discharge Instructions (Signed)
 You were evaluated in the Emergency Department and after careful evaluation, we did not find any emergent condition requiring admission or further testing in the hospital.  Your exam/testing today is overall reassuring.  X-ray did not show any broken bones.  Use the Naprosyn  anti-inflammatory at home twice daily for pain.  Continue cold compresses for the next 2 days.  Rest of the hand for the next few days.  Please return to the Emergency Department if you experience any worsening of your condition.   Thank you for allowing us  to be a part of your care.

## 2024-03-03 NOTE — ED Provider Notes (Signed)
 [facility] DEPT [facility] Emergency Department Provider Note MRN:  [id number]  Arrival date & time: 03/03/24     Chief Complaint   Hand injury History of Present Illness   Robert Parrish is a 33 y.o. year-old male with no pertinent past medical history presenting to the ED with chief complaint of hand injury.  Hand got caught in a printing press at work.  Hand significantly squeezed by the rollers within the machine.  Having a lot of pain to the hand and fingers.  No other injuries.  Review of Systems  A thorough review of systems was obtained and all systems are negative except as noted in the HPI and PMH.   Patient's Health History   History reviewed. No pertinent past medical history.  History reviewed. No pertinent surgical history.  History reviewed. No pertinent family history.  Social History   Socioeconomic History   Marital status: Single    Spouse name: Not on file   Number of children: Not on file   Years of education: Not on file   Highest education level: Not on file  Occupational History   Not on file  Tobacco Use   Smoking status: Never   Smokeless tobacco: Not on file  Vaping Use   Vaping status: Never Used  Substance and Sexual Activity   Alcohol use: No   Drug use: No   Sexual activity: Not on file  Other Topics Concern   Not on file  Social History Narrative   Not on file   Social Drivers of Health   Financial Resource Strain: Not on file  Food Insecurity: Not on file  Transportation Needs: Not on file  Physical Activity: Not on file  Stress: Not on file  Social Connections: Not on file  Intimate Partner Violence: Not on file     Physical Exam   Vitals:   03/03/24 0313 03/03/24 0400  BP:  (!) 134/96  Pulse: 73 64  Resp: 17   Temp: 97.9 F (36.6 C)   SpO2: 100% 100%    CONSTITUTIONAL: Well-appearing, NAD NEURO/PSYCH:  Alert and oriented x 3, no focal deficits EYES:  eyes equal and reactive ENT/NECK:  no LAD, no  JVD CARDIO: Regular rate, well-perfused, normal S1 and S2 PULM:  CTAB no wheezing or rhonchi GI/GU:  non-distended, non-tender MSK/SPINE:  No gross deformities, no edema SKIN: Bruising and swelling to the left hand and fingers   *Additional and/or pertinent findings included in MDM below  Diagnostic and Interventional Summary    EKG Interpretation Date/Time:    Ventricular Rate:    PR Interval:    QRS Duration:    QT Interval:    QTC Calculation:   R Axis:      Text Interpretation:         Labs Reviewed - No data to display  DG Hand Complete Left  Final Result      Medications  HYDROcodone -acetaminophen  (NORCO/VICODIN) 5-325 MG per tablet 1 tablet (1 tablet Oral Given 03/03/24 0347)     Procedures  /  Critical Care Procedures  ED Course and Medical Decision Making  Initial Impression and Ddx Crush injury, question fracture versus bruising, neurovascularly intact.  Past medical/surgical history that increases complexity of ED encounter: None  Interpretation of Diagnostics I personally reviewed the hand x-ray and my interpretation is as follows: No fracture    Patient Reassessment and Ultimate Disposition/Management     No significant injury evident, appropriate for discharge.  Patient management required discussion  with the following services or consulting groups:  None  Complexity of Problems Addressed Acute illness or injury that poses threat of life of bodily function  Additional Data Reviewed and Analyzed Further history obtained from: None  Additional Factors Impacting ED Encounter Risk Prescriptions  Ozell HERO. Theadore, MD [facility] Emergency Medicine [facility] [email or web address]  Final Clinical Impressions(s) / ED Diagnoses     ICD-10-CM   1. Injury of left hand, initial encounter  S69.92XA       ED Discharge Orders          Ordered    naproxen  (NAPROSYN ) 500 MG tablet  2 times daily        03/03/24 0431              Discharge Instructions Discussed with and Provided to Patient:     Discharge Instructions      You were evaluated in the Emergency Department and after careful evaluation, we did not find any emergent condition requiring admission or further testing in the hospital.  Your exam/testing today is overall reassuring.  X-ray did not show any broken bones.  Use the Naprosyn  anti-inflammatory at home twice daily for pain.  Continue cold compresses for the next 2 days.  Rest of the hand for the next few days.  Please return to the Emergency Department if you experience any worsening of your condition.   Thank you for allowing us  to be a part of your care.       Theadore Ozell HERO, MD 03/03/24 [phone]

## 2024-04-27 ENCOUNTER — Ambulatory Visit (HOSPITAL_COMMUNITY): Payer: Self-pay

## 2024-04-27 ENCOUNTER — Encounter (HOSPITAL_COMMUNITY): Payer: Self-pay | Admitting: Emergency Medicine

## 2024-04-27 ENCOUNTER — Ambulatory Visit (HOSPITAL_COMMUNITY)
Admission: EM | Admit: 2024-04-27 | Discharge: 2024-04-27 | Disposition: A | Discharge status: Home / Self Care | Payer: Self-pay | Attending: Internal Medicine | Admitting: Internal Medicine

## 2024-04-27 ENCOUNTER — Other Ambulatory Visit: Payer: Self-pay

## 2024-04-27 DIAGNOSIS — M25572 Pain in left ankle and joints of left foot: Secondary | ICD-10-CM

## 2024-04-27 DIAGNOSIS — M79605 Pain in left leg: Secondary | ICD-10-CM

## 2024-04-27 DIAGNOSIS — M25562 Pain in left knee: Secondary | ICD-10-CM

## 2024-04-27 DIAGNOSIS — M25522 Pain in left elbow: Secondary | ICD-10-CM

## 2024-04-27 MED ORDER — BACITRACIN ZINC 500 UNIT/GM EX OINT
TOPICAL_OINTMENT | Freq: Once | CUTANEOUS | Status: AC
  Administered 2024-04-27: 1 via TOPICAL

## 2024-04-27 MED ORDER — NAPROXEN 500 MG PO TABS
500.0000 mg | ORAL_TABLET | Freq: Two times a day (BID) | ORAL | 0 refills | Status: DC
Start: 2024-04-27 — End: 2024-05-29

## 2024-04-27 MED ORDER — BACITRACIN ZINC 500 UNIT/GM EX OINT
TOPICAL_OINTMENT | CUTANEOUS | Status: AC
  Filled 2024-04-27: qty 0.9

## 2024-04-27 MED ORDER — IBUPROFEN 800 MG PO TABS
800.0000 mg | ORAL_TABLET | Freq: Once | ORAL | Status: AC
  Administered 2024-04-27: 800 mg via ORAL

## 2024-04-27 MED ORDER — IBUPROFEN 800 MG PO TABS
ORAL_TABLET | ORAL | Status: AC
  Filled 2024-04-27: qty 1
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
## 2024-04-27 NOTE — ED Notes (Signed)
 Provided supplies for dressing changes at home
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
## 2024-04-27 NOTE — ED Notes (Signed)
Remains in x-ray  

## 2024-05-29 ENCOUNTER — Other Ambulatory Visit: Payer: Self-pay

## 2024-05-29 ENCOUNTER — Emergency Department (HOSPITAL_COMMUNITY)
Admission: EM | Admit: 2024-05-29 | Discharge: 2024-05-29 | Disposition: A | Discharge status: Home / Self Care | Payer: Self-pay

## 2024-05-29 ENCOUNTER — Emergency Department (HOSPITAL_COMMUNITY): Payer: Self-pay

## 2024-05-29 ENCOUNTER — Encounter (HOSPITAL_COMMUNITY): Payer: Self-pay

## 2024-05-29 DIAGNOSIS — R0789 Other chest pain: Secondary | ICD-10-CM | POA: Insufficient documentation

## 2024-05-29 LAB — CBC
HCT: 48.2 % (ref 39.0–52.0)
Hemoglobin: 16.4 g/dL (ref 13.0–17.0)
MCH: 26.8 pg (ref 26.0–34.0)
MCHC: 34 g/dL (ref 30.0–36.0)
MCV: 78.9 fL — ABNORMAL LOW (ref 80.0–100.0)
Platelets: 177 K/uL (ref 150–400)
RBC: 6.11 MIL/uL — ABNORMAL HIGH (ref 4.22–5.81)
RDW: 13.4 % (ref 11.5–15.5)
WBC: 4.9 K/uL (ref 4.0–10.5)
nRBC: 0 % (ref 0.0–0.2)

## 2024-05-29 LAB — BASIC METABOLIC PANEL WITH GFR
Anion gap: 11 (ref 5–15)
BUN: 11 mg/dL (ref 6–20)
CO2: 27 mmol/L (ref 22–32)
Calcium: 9.4 mg/dL (ref 8.9–10.3)
Chloride: 104 mmol/L (ref 98–111)
Creatinine, Ser: 1.03 mg/dL (ref 0.61–1.24)
GFR, Estimated: >60 mL/min (ref >60)
Glucose, Bld: 103 mg/dL — ABNORMAL HIGH (ref 70–99)
Potassium: 3.8 mmol/L (ref 3.5–5.1)
Sodium: 141 mmol/L (ref 135–145)

## 2024-05-29 LAB — TROPONIN T, HIGH SENSITIVITY
Troponin T High Sensitivity: <15 ng/L (ref 0–19)
Troponin T High Sensitivity: <15 ng/L (ref 0–19)

## 2024-05-29 MED ORDER — KETOROLAC TROMETHAMINE 15 MG/ML IJ SOLN
15.0000 mg | Freq: Once | INTRAMUSCULAR | Status: AC
  Administered 2024-05-29: 15 mg via INTRAMUSCULAR
  Filled 2024-05-29: qty 1

## 2024-05-29 MED ORDER — NAPROXEN 500 MG PO TABS: 500.0000 mg | ORAL_TABLET | Freq: Two times a day (BID) | ORAL | 0 refills | Status: AC

## 2024-05-29 NOTE — Discharge Instructions (Addendum)
 Take your naproxen  as prescribed.  You can take Tylenol  as needed for pain.  Call a primary care doctor and follow-up/establish care for the next couple weeks.  Return to the ER for new or worsening symptoms

## 2024-05-29 NOTE — ED Provider Triage Note (Signed)
 Emergency Medicine Provider Triage Evaluation Note  Robert Parrish , a 33 y.o. male  was evaluated in triage.  Pt complains of L sided chest pain that radiates to L arm that started yesterday. Patient states that pain comes and goes and is episodic. Patient also appreciates pain when taking a deep breath. Denies hx of heart issues and takes no prescription medications at home. No hx of heart attack or blood clot. Denies fever, chills.   Review of Systems  Positive: Chest pain, pain when taking a deep breath Negative: Fever, chills, shortness of breath, cough  Physical Exam  BP (!) 154/82 (BP Location: Right Arm)   Pulse 80   Temp 98.9 F (37.2 C) (Oral)   Resp 18   SpO2 98%  Gen:   Awake, no distress   Resp:  Normal effort  MSK:   Moves extremities without difficulty  Other:    Medical Decision Making  Medically screening exam initiated at 7:57 PM.  Appropriate orders placed.  Keonte Zeidman was informed that the remainder of the evaluation will be completed by another provider, this initial triage assessment does not replace that evaluation, and the importance of remaining in the ED until their evaluation is complete.  Orders: CBC, BMP, CXR, Troponin, EKG   Janetta Terrall FALCON, [place] 05/29/24 1959
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
## 2024-05-29 NOTE — ED Triage Notes (Signed)
 Pt reports with left sided chest pain that radiates to left arm since today. Pt states that it hurts when he takes a breath.

## 2024-07-30 ENCOUNTER — Emergency Department (HOSPITAL_COMMUNITY)
Admission: EM | Admit: 2024-07-30 | Discharge: 2024-07-30 | Disposition: A | Discharge status: Home / Self Care | Payer: Self-pay | Attending: Emergency Medicine | Admitting: Emergency Medicine

## 2024-07-30 ENCOUNTER — Other Ambulatory Visit: Payer: Self-pay

## 2024-07-30 ENCOUNTER — Emergency Department (HOSPITAL_COMMUNITY): Payer: Self-pay

## 2024-07-30 DIAGNOSIS — J069 Acute upper respiratory infection, unspecified: Secondary | ICD-10-CM | POA: Insufficient documentation

## 2024-07-30 LAB — RESP PANEL BY RT-PCR (RSV, FLU A&B, COVID)  RVPGX2
Influenza A by PCR: NEGATIVE
Influenza B by PCR: NEGATIVE
Resp Syncytial Virus by PCR: NEGATIVE
SARS Coronavirus 2 by RT PCR: NEGATIVE

## 2024-07-30 MED ORDER — ACETAMINOPHEN 325 MG PO TABS
650.0000 mg | ORAL_TABLET | Freq: Once | ORAL | Status: AC | PRN
  Administered 2024-07-30: 650 mg via ORAL
  Filled 2024-07-30: qty 2

## 2024-07-30 MED ORDER — ACETAMINOPHEN ER 650 MG PO TBCR: 650.0000 mg | EXTENDED_RELEASE_TABLET | Freq: Three times a day (TID) | ORAL | 0 refills | Status: AC | PRN

## 2024-07-30 MED ORDER — ONDANSETRON 4 MG PO TBDP
4.0000 mg | ORAL_TABLET | Freq: Once | ORAL | Status: AC
  Administered 2024-07-30: 4 mg via ORAL
  Filled 2024-07-30: qty 1

## 2024-07-30 MED ORDER — ONDANSETRON 8 MG PO TBDP: 8.0000 mg | ORAL_TABLET | Freq: Three times a day (TID) | ORAL | 0 refills | Status: AC | PRN

## 2024-07-30 NOTE — ED Triage Notes (Signed)
 PT arrives via POV. PT reports cough, congestion, fever, chills, and nausea since last night. Pt AxOx4.

## 2024-07-30 NOTE — ED Provider Notes (Signed)
 " Robert Parrish Provider Note   CSN: [id number] Arrival date & time: 07/30/24  1433     Patient presents with: Fever, Cough, Nasal Congestion, and Nausea   Robert Parrish is a 34 y.o. male.   HPI    SUBJECTIVE:  Robert Parrish is a 34 y.o. male who complains of congestion, dry cough, myalgias, headache, sore throat and fever for 1 days.  Patient also reports some nausea, but denies any vomiting today.  He denies any sick contacts.    Prior to Admission medications  Medication Sig Start Date End Date Taking? Authorizing Provider  acetaminophen  (TYLENOL  8 HOUR) 650 MG CR tablet Take 1 tablet (650 mg total) by mouth every 8 (eight) hours as needed for pain or fever. 07/30/24  Yes Robert Sora, MD  ondansetron  (ZOFRAN -ODT) 8 MG disintegrating tablet Take 1 tablet (8 mg total) by mouth every 8 (eight) hours as needed for nausea. 07/30/24  Yes Robert Sora, MD  promethazine -dextromethorphan (PROMETHAZINE -DM) 6.25-15 MG/5ML syrup Take 5 mLs by mouth 4 (four) times daily as needed for cough. Do not take with alcohol or while driving or operating heavy machinery.  May cause drowsiness. 05/16/23   Robert Parrish LABOR, NP    Allergies: Patient has no known allergies.    Review of Systems  Updated Vital Signs BP 139/87 (BP Location: Right Arm)   Pulse (!) 110   Temp 99.8 F (37.7 C) (Oral)   Resp (!) 26   Ht 5' 10 (1.778 m)   Wt 102.1 kg   SpO2 100%   BMI 32.30 kg/m   Physical Exam Vitals and nursing note reviewed.  Constitutional:      Appearance: He is well-developed. He is not ill-appearing or toxic-appearing.  HENT:     Head: Atraumatic.  Eyes:     Pupils: Pupils are equal, round, and reactive to light.  Cardiovascular:     Rate and Rhythm: Tachycardia present.  Pulmonary:     Effort: Pulmonary effort is normal.  Musculoskeletal:     Cervical back: Neck supple.  Lymphadenopathy:     Cervical: Cervical adenopathy present.   Skin:    General: Skin is warm.  Neurological:     Mental Status: He is alert and oriented to person, place, and time.     (all labs ordered are listed, but only abnormal results are displayed) Labs Reviewed  RESP PANEL BY RT-PCR (RSV, FLU A&B, COVID)  [id number]    EKG: None  Radiology: DG Chest 2 View Result Date: 07/30/2024 EXAM: 2 VIEW(S) XRAY OF THE CHEST 07/30/2024 03:15:00 PM COMPARISON: 05/29/2024 CLINICAL HISTORY: cough, shortness of breath, fever FINDINGS: LUNGS AND PLEURA: No focal pulmonary opacity. No pleural effusion. No pneumothorax. HEART AND MEDIASTINUM: No acute abnormality of the cardiac and mediastinal silhouettes. BONES AND SOFT TISSUES: No acute osseous abnormality. IMPRESSION: 1. No acute findings. Electronically signed by: Norleen Boxer MD 07/30/2024 04:05 PM EST RP Workstation: [id number]     Procedures   Medications Ordered in the ED  acetaminophen  (TYLENOL ) tablet 650 mg (650 mg Oral Given 07/30/24 1441)  ondansetron  (ZOFRAN -ODT) disintegrating tablet 4 mg (4 mg Oral Given 07/30/24 1459)                                    Medical Decision Making Risk OTC drugs. Prescription drug management.    Patient appears well, non toxic, no respiratory distress, vital  signs are as noted.  Throat and pharynx are showing some erythema, no exudates.  Neck is supple.  Positive adenopathy in the neck. The chest is clear, without wheezes or rales.  Differential diagnosis considered for this patient includes: Viral syndrome Influenza Pharyngitis Sinusitis Mononucleosis Electrolyte abnormality   ASSESSMENT:  viral upper respiratory illness  PLAN: Symptomatic therapy suggested: push fluids, rest, and return office visit prn if symptoms persist or worsen. Lack of antibiotic effectiveness discussed with her. Call or return to clinic prn if these symptoms worsen or fail to improve as anticipated.   Final diagnoses:  Viral URI with cough    ED Discharge Orders           Ordered    ondansetron  (ZOFRAN -ODT) 8 MG disintegrating tablet  Every 8 hours PRN        07/30/24 1623    acetaminophen  (TYLENOL  8 HOUR) 650 MG CR tablet  Every 8 hours PRN        07/30/24 1623               Robert Sora, MD 07/30/24 1637  "

## 2024-07-30 NOTE — Discharge Instructions (Addendum)
 We saw you in the ER for fever, cough, congestion. Flu, COVID test and RSV test are negative.  We think what you have is a viral syndrome - the treatment for which is symptomatic relief only, and your body will fight the infection off in a few days. We are prescribing you some meds for pain and fevers. See your primary care doctor in 1 week if the symptoms dont improve.

## 2024-07-30 NOTE — ED Provider Triage Note (Signed)
 Emergency Medicine Provider Triage Evaluation Note  Robert Parrish , a 34 y.o. male  was evaluated in triage.  Pt complains of cough, congestion, fever, as well as nausea since last night.  No vomiting.  No known sick contacts.  Patient has not had any medications for pain or fever.  Subjective shortness of breath.  States that cough is productive.  No recent travel.  Otherwise healthy, no prescription medications at home.  Review of Systems  Positive: Cough, fever, nausea Negative: Chest pain, urinary symptoms, abdominal pain  Physical Exam  BP 139/87 (BP Location: Right Arm)   Pulse (!) 110   Temp (!) 101.9 F (38.8 C)   Resp (!) 26   Ht 5' 10 (1.778 m)   Wt 102.1 kg   SpO2 100%   BMI 32.30 kg/m  Gen:   Awake, no distress   Resp:  Normal effort, patient talking in full sentences on room air, lung sound clear MSK:   Moves extremities without difficulty  Other:  Abdomen soft and nontender  Medical Decision Making  Medically screening exam initiated at 2:49 PM.  Appropriate orders placed.  Jarmarcus Ohman was informed that the remainder of the evaluation will be completed by another provider, this initial triage assessment does not replace that evaluation, and the importance of remaining in the ED until their evaluation is complete.  Orders: Respiratory panel, Zofran , chest x-ray   Janetta Terrall FALCON, PA-C 07/30/24 1451
